# Patient Record
Sex: Male | Born: 1970
Health system: Southern US, Community
[De-identification: ages and names within clinical notes are randomized; demographics above are authoritative.]

## PROBLEM LIST (undated history)

## (undated) DIAGNOSIS — K4090 Unilateral inguinal hernia, without obstruction or gangrene, not specified as recurrent: Secondary | ICD-10-CM

## (undated) HISTORY — PX: PILONIDAL CYST EXCISION: SHX744

## (undated) HISTORY — PX: OTHER SURGICAL HISTORY: SHX169

## (undated) HISTORY — DX: Unilateral inguinal hernia, without obstruction or gangrene, not specified as recurrent: K40.90

---

## 1998-12-04 ENCOUNTER — Emergency Department (HOSPITAL_COMMUNITY): Admission: EM | Admit: 1998-12-04 | Discharge: 1998-12-04 | Payer: Self-pay | Admitting: Emergency Medicine

## 2006-10-31 ENCOUNTER — Encounter: Admission: RE | Admit: 2006-10-31 | Discharge: 2006-10-31 | Payer: Self-pay | Admitting: Family Medicine

## 2009-11-11 ENCOUNTER — Emergency Department (HOSPITAL_COMMUNITY): Admission: EM | Admit: 2009-11-11 | Discharge: 2009-11-11 | Payer: Self-pay | Admitting: Emergency Medicine

## 2011-02-09 DIAGNOSIS — L0591 Pilonidal cyst without abscess: Secondary | ICD-10-CM | POA: Insufficient documentation

## 2011-02-09 DIAGNOSIS — L723 Sebaceous cyst: Secondary | ICD-10-CM | POA: Insufficient documentation

## 2012-10-23 ENCOUNTER — Other Ambulatory Visit: Payer: Self-pay | Admitting: Family Medicine

## 2012-10-23 ENCOUNTER — Ambulatory Visit
Admission: RE | Admit: 2012-10-23 | Discharge: 2012-10-23 | Disposition: A | Discharge status: Home / Self Care | Payer: BC Managed Care – PPO | Source: Ambulatory Visit | Attending: Family Medicine | Admitting: Family Medicine

## 2012-10-23 DIAGNOSIS — R1032 Left lower quadrant pain: Secondary | ICD-10-CM

## 2012-10-23 MED ORDER — IOHEXOL 300 MG/ML  SOLN
100.0000 mL | Freq: Once | INTRAMUSCULAR | Status: AC | PRN
  Administered 2012-10-23: 100 mL via INTRAVENOUS

## 2012-12-16 DIAGNOSIS — K4090 Unilateral inguinal hernia, without obstruction or gangrene, not specified as recurrent: Secondary | ICD-10-CM

## 2012-12-16 HISTORY — DX: Unilateral inguinal hernia, without obstruction or gangrene, not specified as recurrent: K40.90

## 2013-01-09 ENCOUNTER — Ambulatory Visit (INDEPENDENT_AMBULATORY_CARE_PROVIDER_SITE_OTHER): Payer: BC Managed Care – PPO | Admitting: General Surgery

## 2013-01-09 ENCOUNTER — Encounter (INDEPENDENT_AMBULATORY_CARE_PROVIDER_SITE_OTHER): Payer: Self-pay | Admitting: General Surgery

## 2013-01-09 VITALS — BP 122/80 | HR 64 | Temp 98.4°F | Resp 16 | Ht 75.0 in | Wt 218.8 lb

## 2013-01-09 DIAGNOSIS — K4090 Unilateral inguinal hernia, without obstruction or gangrene, not specified as recurrent: Secondary | ICD-10-CM

## 2013-01-09 NOTE — Patient Instructions (Addendum)

## 2013-01-09 NOTE — Progress Notes (Signed)
Subjective:   left lower quadrant pain, inguinal hernia  Patient ID: Shannon Ford, male   DOB: 1971-04-08, 42 y.o.   MRN: [id number]  HPI Patient is a pleasant 42 year old male referred by Dr. Bosie Clos for recurrent left-sided abdominal pain and a left inguinal hernia. The patient's history dates back to an initial episode in 2007. At that time he developed pain in his left lower quadrant which he localizes just medial to his iliac crest.  This became fairly severe at that time. It seemed to be related to activity. He also had some associated diarrhea and fever and chills. A CT scan at that time was done which by history questioned diverticulitis but I do not have this report.  He also at that time was found to have a left inguinal hernia. He did have a surgical consultation but the upper shot of this was that the repair was not recommended. This illness gradually resolved. Of note he did have a colonoscopy at that time which was negative without evidence of colitis or diverticulosis. In the last few years he has had occasional twinges of discomfort usually related to activity but nothing that was severe enough to warrant medical attention. The patient also has a history of a ruptured lumbar disc by self history in college with right leg pain but the symptoms resolved without surgery. He has also been seen by urology for some urinary hesitancy. In April of this year he had recurrent pain very similar to what he had a 2008. A CT scan was obtained which I've reviewed showing some possible mild thickening of the sigmoid colon, very questionable, but no evidence of acute diverticulitis or other abnormality. Again this pain has gradually improved and now he only has occasional mild twinges of pain. This most recent illness also was associated with fever and chills and some diarrhea.  No past medical history on file. Past Surgical History  Procedure Laterality Date  . Pilonidal cyst excision    . Lazer vein  surgey     Current Outpatient Prescriptions  Medication Sig Dispense Refill  . amoxicillin (AMOXIL) 875 MG tablet       . benzonatate (TESSALON) 100 MG capsule       . fluticasone (FLONASE) 50 MCG/ACT nasal spray       . hyoscyamine (LEVBID) 0.375 MG 12 hr tablet       . metroNIDAZOLE (FLAGYL) 500 MG tablet       . nystatin (MYCOSTATIN) 100000 UNIT/ML suspension        No current facility-administered medications for this visit.   Allergies  Allergen Reactions  . Barium-Containing Compounds Itching    NOT CERTAIN if patient is allergic to readycat or not, started itching before 2nd bottle as oral contrast for ct scan, a. calhoun   History  Substance Use Topics  . Smoking status: Former Smoker    Quit date: 01/10/2012  . Smokeless tobacco: Never Used  . Alcohol Use: 2.4 oz/week    4 Cans of beer per week     Review of Systems  Constitutional: Negative.   Respiratory: Negative.   Cardiovascular: Negative.   Gastrointestinal: Positive for abdominal pain. Negative for nausea, diarrhea and blood in stool.       Objective:   Physical Exam BP 122/80  Pulse 64  Temp(Src) 98.4 F (36.9 C) (Temporal)  Resp 16  Ht 6\' 3"  (1.905 m)  Wt 218 lb 12.8 oz (99.247 kg)  BMI 27.35 kg/m2 General: Healthy-appearing Caucasian male in  no distress Skin: No rash or infection Lungs: Clear good breath sounds without increased work of breathing Cardiac regular rate and rhythm without murmurs. No edema. Abdomen: Soft with very minimal tenderness in the left lower quadrant and no guarding or palpable mass. No organomegaly. With the patient coughing and straining there is a small but definite probably indirect left inguinal hernia. It is not particularly tender. I cannot feel any hernia on the right GU normal male no testicular masses or tenderness Extremities: No joint swelling or deformity or edema    Assessment:     Recurrent left lower quadrant abdominal pain. He definitely has a small  left inguinal hernia on exam. However his symptoms are not typical for this pain being a little higher in the abdomen and 2 episodes of acute onset that have been associated with fever and change in bowel habits. However workup with colonoscopy and CT scan was essentially unremarkable. A long discussion with the patient regarding possible diagnoses including recurrent colitis or referred pain from his back or possibly pain from his left inguinal hernia. At his age and in good health he would be reasonable to fix his hernia even if it were asymptomatic. I however could not begin to guarantee that it would relieve his discomfort. We discussed laparoscopic repair of his left femoral hernia in detail including the nature of the surgery and expected recovery as well as risks of anesthetic complications, bleeding, infection, conversion to open procedure, rare risk of visceral or nerve injury and possible chronic pain secondary to the mesh. After discussion he feels like he would like to have his hernia fixed toward the end of the year and I think this is reasonable.    Plan:     laparoscopic repair of left inguinal hernia under general anesthesia as an outpatient. He will be in touch with [place] a later in the year regarding scheduling

## 2013-07-26 ENCOUNTER — Encounter (INDEPENDENT_AMBULATORY_CARE_PROVIDER_SITE_OTHER): Payer: Self-pay | Admitting: General Surgery

## 2013-07-26 ENCOUNTER — Ambulatory Visit (INDEPENDENT_AMBULATORY_CARE_PROVIDER_SITE_OTHER): Payer: BC Managed Care – PPO | Admitting: General Surgery

## 2013-07-26 ENCOUNTER — Encounter (INDEPENDENT_AMBULATORY_CARE_PROVIDER_SITE_OTHER): Payer: Self-pay

## 2013-07-26 VITALS — BP 136/84 | HR 66 | Temp 97.0°F | Resp 16 | Ht 75.0 in | Wt 225.8 lb

## 2013-07-26 DIAGNOSIS — K4090 Unilateral inguinal hernia, without obstruction or gangrene, not specified as recurrent: Secondary | ICD-10-CM

## 2013-07-26 NOTE — Progress Notes (Signed)
Chief complaint: Followup within one hernia  History: The patient returns to the office for followup of his left renal hernia. Please see my note from June of 2014. He has a history of possible diverticulitis or colitis with an episode of diarrhea and fever and chills in 2007. CT scan questioned diverticulitis at that time. He was also found to have a left inguinal hernia. He had colonoscopy at that time which was unremarkable. Since then he has continued to have some intermittent discomfort mainly in his left lower quadrant. He also since his last visit this past summer has had some increasing episodes of discomfort in his left groin.  No severe pain.  Past Medical History  Diagnosis Date  . Inguinal hernia without mention of obstruction or gangrene, unilateral or unspecified, (not specified as recurrent) 12/2012    [facility]   Past Surgical History  Procedure Laterality Date  . Pilonidal cyst excision    . Lazer vein surgey     No current outpatient prescriptions on file.   No current facility-administered medications for this visit.   Allergies  Allergen Reactions  . Barium-Containing Compounds Itching    NOT CERTAIN if patient is allergic to readycat or not, started itching before 2nd bottle as oral contrast for ct scan, a. calhoun   History  Substance Use Topics  . Smoking status: Former Smoker    Quit date: 01/10/2012  . Smokeless tobacco: Never Used  . Alcohol Use: 2.4 oz/week    4 Cans of beer per week   Exam: BP 136/84  Pulse 66  Temp(Src) 97 F (36.1 C) (Temporal)  Resp 16  Ht 6\' 3"  (1.905 m)  Wt 225 lb 12.8 oz (102.422 kg)  BMI 28.22 kg/m2 General: Well-developed Caucasian male in no distress Lungs: Clear breath sounds without resort reading Cardiac: Regular rate and rhythm. Abdomen: Standing and coughing and straining there is a definite small reducible left inguinal hernia which is probably indirect. I cannot feel any evidence of hernia on the right.  Assessment  and plan: Left inguinal hernia. As we discussed previously I cannot be sure that this is responsible for all the discomfort in his abdomen but I believe it is symptomatic and should be repaired. We discussed laparoscopic repair including the nature of the surgery and recovery as well as risks of bleeding, infection, visceral injury, anesthetic risk, recurrence and possible need for open repair.  We discussed that his higher left lower quadrant discomfort may or may not be related to the hernia. He like to go ahead with repair which is very reasonable we will schedule this for them under general anesthesia as an outpatient

## 2013-08-19 ENCOUNTER — Other Ambulatory Visit (INDEPENDENT_AMBULATORY_CARE_PROVIDER_SITE_OTHER): Payer: Self-pay | Admitting: *Deleted

## 2013-08-19 DIAGNOSIS — K4090 Unilateral inguinal hernia, without obstruction or gangrene, not specified as recurrent: Secondary | ICD-10-CM

## 2013-08-19 HISTORY — PX: HERNIA REPAIR: SHX51

## 2013-08-19 MED ORDER — OXYCODONE-ACETAMINOPHEN 5-325 MG PO TABS: 1.0000 | ORAL_TABLET | ORAL | Status: DC | PRN

## 2013-08-28 ENCOUNTER — Ambulatory Visit (INDEPENDENT_AMBULATORY_CARE_PROVIDER_SITE_OTHER): Payer: BC Managed Care – PPO | Admitting: General Surgery

## 2013-08-28 ENCOUNTER — Encounter (INDEPENDENT_AMBULATORY_CARE_PROVIDER_SITE_OTHER): Payer: Self-pay | Admitting: General Surgery

## 2013-08-28 VITALS — BP 126/82 | HR 80 | Temp 98.0°F | Resp 18 | Ht 73.0 in | Wt 218.0 lb

## 2013-08-28 DIAGNOSIS — Z09 Encounter for follow-up examination after completed treatment for conditions other than malignant neoplasm: Secondary | ICD-10-CM

## 2013-08-28 NOTE — Progress Notes (Signed)
History: Patient returns to the office 9 days following laparoscopic left inguinal hernia repair. He's been getting along well. A lot of soreness for the first few days but he is getting back to her normal activity with now just mild discomfort.  Exam: BP 126/82  Pulse 80  Temp(Src) 98 F (36.7 C)  Resp 18  Ht 6\' 1"  (1.854 m)  Wt 218 lb (98.884 kg)  BMI 28.77 kg/m2 General: Appears well Abdomen: Incisions are well-healed. There is mild tenderness in the left anal area but no evidence of hernia on exam.  Assessment and plan: Doing well following laparoscopic left anal hernia repair. We discussed a scheduled for return to normal activity. He is encouraged to call for any concerns and I will see him back as needed.

## 2013-09-13 ENCOUNTER — Ambulatory Visit (INDEPENDENT_AMBULATORY_CARE_PROVIDER_SITE_OTHER): Payer: BC Managed Care – PPO | Admitting: General Surgery

## 2013-09-13 ENCOUNTER — Encounter (INDEPENDENT_AMBULATORY_CARE_PROVIDER_SITE_OTHER): Payer: Self-pay | Admitting: General Surgery

## 2013-09-13 VITALS — BP 136/84 | HR 80 | Temp 97.0°F | Resp 16 | Ht 75.0 in | Wt 219.8 lb

## 2013-09-13 DIAGNOSIS — Z09 Encounter for follow-up examination after completed treatment for conditions other than malignant neoplasm: Secondary | ICD-10-CM

## 2013-09-13 NOTE — Patient Instructions (Signed)
Call as needed for any worsening symptoms or questions. Can take Aleve twice daily according to package instructions. We will see her back for recheck in 3 weeks.

## 2013-09-13 NOTE — Progress Notes (Signed)
Chief complaint: Left groin pain post hernia repair  History: This return to the office now 3 weeks following laparoscopic repair of a small indirect left inguinal hernia. I saw him 9 days postoperatively and he was doing very well. However beginning 3 days ago he has been having intermittent sharp pain in his left groin. On questioning this often seems to be related to flexing the hip. The pain will come on suddenly and be severe enough that he has a hard time moving or walking. It will last half-hour or a 3 hours and then typically will suddenly go away. He has felt a little up in the area of his previous hernia.  Exam: BP 136/84  Pulse 80  Temp(Src) 97 F (36.1 C) (Temporal)  Resp 16  Ht 6\' 3"  (1.905 m)  Wt 219 lb 12.8 oz (99.701 kg)  BMI 27.47 kg/m2 General: Appears well Abdomen: There is a small tender nodule about 1 cm at the level of the internal ring on the left. With coughing and straining there appears to be somewhat of an impulse here but it is subtle and I cannot be sure if this might be a recurrent hernia or possibly an area of inflammation and tenderness at the internal ring/spermatic cord..  Assessment and plan: Left groin pain post laparoscopic left inguinal hernia repair.  Possible early recurrence versus inflammation and tenderness at the internal ring/spermatic cord. I discussed these possibilities with the patient. At this point I think close observation is the best course. He will call me for any worsening pain. He will try to limit his physical activity somewhat. I will see him back in 3 weeks or sooner if necessary.

## 2013-09-30 ENCOUNTER — Encounter (INDEPENDENT_AMBULATORY_CARE_PROVIDER_SITE_OTHER): Payer: BC Managed Care – PPO | Admitting: General Surgery

## 2013-12-06 ENCOUNTER — Encounter: Payer: Self-pay | Admitting: Podiatrist

## 2013-12-06 ENCOUNTER — Ambulatory Visit (INDEPENDENT_AMBULATORY_CARE_PROVIDER_SITE_OTHER): Payer: BC Managed Care – PPO | Admitting: Podiatrist

## 2013-12-06 VITALS — BP 162/98 | HR 60 | Resp 12

## 2013-12-06 DIAGNOSIS — M722 Plantar fascial fibromatosis: Secondary | ICD-10-CM

## 2013-12-06 MED ORDER — TRIAMCINOLONE ACETONIDE 40 MG/ML IJ SUSP
20.0000 mg | Freq: Once | INTRAMUSCULAR | Status: AC
  Administered 2013-12-06: 20 mg

## 2013-12-06 NOTE — Progress Notes (Signed)
  Chief Complaint  Patient presents with  . Plantar Fasciitis    '' RT FOOT START HURTING AGAIN.''     HPI: Patient is 43 y.o. male who presents today for follow up of plantar fasciitis of the right foot.  He states his orthotics are comfortable but he is able to wear them in his athletic shoes but  Not his workboots.  He would like another pair for his work boots so he can wear them all the time.      Physical Exam Patient is awake, alert, and oriented x 3.  In no acute distress.  Vascular status is intact with palpable pedal pulses at 2/4 DP and PT bilateral and capillary refill time within normal limits. Neurological sensation is also intact bilaterally via Semmes Weinstein monofilament at 5/5 sites. Light touch, vibratory sensation, Achilles tendon reflex is intact. Dermatological exam reveals skin color, turger and texture as normal. No open lesions present.  Musculature intact with dorsiflexion, plantarflexion, inversion, eversion.  Pain on palpation plantar medial aspect of the right heel consistent with plantar fasciitis symptomatology is noted. High arched foot type bilaterally is noted.   Assessment: Plantar fasciitis right  Discussed etiology, pathology, conservative vs. Surgical therapies and at this time a plantar fascial injection was recommended.  The patient agreed and a sterile skin prep was applied.  An injection consisting of kenalog and marcaine mixture was infiltrated at the point of maximal tenderness on the right Heel.  The patient tolerated this well and was given instructions for aftercare. He was scanned for orthotics to go in his work boots.  He will bring in his boots to be sure the inserts fit in these shoes and modifications may need to be made.  He will also bring in his old orthotics to adjust as they could fit better in his athletic shoes.

## 2013-12-06 NOTE — Patient Instructions (Signed)

## 2013-12-11 ENCOUNTER — Telehealth: Payer: Self-pay | Admitting: *Deleted

## 2013-12-11 NOTE — Telephone Encounter (Signed)
You gave my husband a call and left him a message today.  We'd like to know if we need to set him up for an appointment or if there's a certain time period he needs to wait before getting another injection?  I returned her call.  I informed her that normally the next injection is given 2-3 weeks later.  I informed her that his orthotics should be here at that time as well.  I mentioned to her that we can tape his foot up he wants to try a strapping.  She said she will mention it to him to see what he thinks.

## 2013-12-11 NOTE — Telephone Encounter (Signed)
He saw her on Friday, had a cortisone shot put in his foot.  He's still having a lot of pain.  He wants to know if he should have immediate relief or does it take a little while for it to kick in.  Call him on his cell.  I called and left him a message that unfortunately we can't determine what affect the cortisone injection is going to have on each person.  It just varies from person to person.  I told him there is a series of 3 injections that can be given.  Hopefully he'll get some relief from the 2nd injection as well as the orthotics.  I asked that he call if he has any further questions or concerns.

## 2013-12-17 ENCOUNTER — Ambulatory Visit (INDEPENDENT_AMBULATORY_CARE_PROVIDER_SITE_OTHER): Payer: BC Managed Care – PPO

## 2013-12-17 VITALS — BP 120/71 | HR 62 | Resp 16

## 2013-12-17 DIAGNOSIS — M722 Plantar fascial fibromatosis: Secondary | ICD-10-CM

## 2013-12-17 MED ORDER — MELOXICAM 15 MG PO TABS: 15.0000 mg | ORAL_TABLET | Freq: Every day | ORAL | Status: DC

## 2013-12-17 MED ORDER — TRIAMCINOLONE ACETONIDE 10 MG/ML IJ SUSP: 10.0000 mg | Freq: Once | INTRAMUSCULAR | Status: DC

## 2013-12-17 NOTE — Patient Instructions (Signed)

## 2013-12-17 NOTE — Progress Notes (Signed)
   Subjective:    Patient ID: Shannon Ford, male    DOB: Sep 16, 1970, 43 y.o.   MRN: [id number]  HPI Comments: "Well, my foot is better, but really like I thought it would be. Still gives me some trouble"  Plantar Fasciitis - Follow up right heel   Dispensed orthotics and reviewed wearing instructions     Review of Systems no new findings or systemic changes noted     Objective:   Physical Exam Neurovascular status is intact pedal pulses palpable patient presents this time for pickup of a second set of orthotics to the lesion is working other shoes hours thickening have pain inferior calcaneal medial been plantar fascia medial trochanter tubercle on the right heel patient had 2 injections one more recent and one half a year ago has orthotics other shoes however the orthotics fit and contour well was given instructions for use the orthotic and break in and patient does have as requested I recommended a posterior injection tender with Kenalog 20 mg Xylocaine plain which is infiltrated to the inferior right heel.       Assessment & Plan:  Assessment recalcitrant plantar fasciitis/heel spur syndrome you orthotics are dispensed at this time a steroid injection tender with Kenalog infiltrated the inferior right heel tolerated well by the patient also recommended ice pack and also prescription MOBIC 15 mg once daily is issued to maintain his anti-inflammatory help with the heel pain recheck with in one month for followup with Dr. Jeannine Boga for her orthotic check and reevaluation for fasciitis if needed  Alvan Dame DPM

## 2013-12-27 ENCOUNTER — Other Ambulatory Visit: Payer: BC Managed Care – PPO

## 2014-03-12 ENCOUNTER — Other Ambulatory Visit: Payer: Self-pay | Admitting: Orthopedic Surgery

## 2014-03-12 DIAGNOSIS — M25511 Pain in right shoulder: Secondary | ICD-10-CM

## 2014-03-16 ENCOUNTER — Other Ambulatory Visit: Payer: Self-pay | Admitting: Orthopedic Surgery

## 2014-03-16 ENCOUNTER — Ambulatory Visit
Admission: RE | Admit: 2014-03-16 | Discharge: 2014-03-16 | Disposition: A | Discharge status: Home / Self Care | Payer: BC Managed Care – PPO | Source: Ambulatory Visit | Attending: Orthopedic Surgery | Admitting: Orthopedic Surgery

## 2014-03-16 ENCOUNTER — Other Ambulatory Visit: Payer: BC Managed Care – PPO

## 2014-03-16 DIAGNOSIS — M25511 Pain in right shoulder: Secondary | ICD-10-CM

## 2014-03-16 DIAGNOSIS — Z77018 Contact with and (suspected) exposure to other hazardous metals: Secondary | ICD-10-CM

## 2015-10-20 DIAGNOSIS — M75111 Incomplete rotator cuff tear or rupture of right shoulder, not specified as traumatic: Secondary | ICD-10-CM | POA: Diagnosis not present

## 2015-10-22 DIAGNOSIS — M75111 Incomplete rotator cuff tear or rupture of right shoulder, not specified as traumatic: Secondary | ICD-10-CM | POA: Diagnosis not present

## 2015-11-06 DIAGNOSIS — M75111 Incomplete rotator cuff tear or rupture of right shoulder, not specified as traumatic: Secondary | ICD-10-CM | POA: Diagnosis not present

## 2015-11-11 DIAGNOSIS — Z4789 Encounter for other orthopedic aftercare: Secondary | ICD-10-CM | POA: Diagnosis not present

## 2015-11-12 DIAGNOSIS — M75111 Incomplete rotator cuff tear or rupture of right shoulder, not specified as traumatic: Secondary | ICD-10-CM | POA: Diagnosis not present

## 2015-12-25 ENCOUNTER — Other Ambulatory Visit: Payer: Self-pay | Admitting: Family Medicine

## 2015-12-25 ENCOUNTER — Ambulatory Visit
Admission: RE | Admit: 2015-12-25 | Discharge: 2015-12-25 | Disposition: A | Discharge status: Home / Self Care | Payer: Managed Care, Other (non HMO) | Source: Ambulatory Visit | Attending: Family Medicine | Admitting: Family Medicine

## 2015-12-25 DIAGNOSIS — R059 Cough, unspecified: Secondary | ICD-10-CM

## 2015-12-25 DIAGNOSIS — R05 Cough: Secondary | ICD-10-CM

## 2017-02-20 ENCOUNTER — Ambulatory Visit (INDEPENDENT_AMBULATORY_CARE_PROVIDER_SITE_OTHER): Payer: Managed Care, Other (non HMO)

## 2017-02-20 ENCOUNTER — Ambulatory Visit (INDEPENDENT_AMBULATORY_CARE_PROVIDER_SITE_OTHER): Payer: Managed Care, Other (non HMO) | Admitting: Podiatry

## 2017-02-20 ENCOUNTER — Encounter: Payer: Self-pay | Admitting: Podiatry

## 2017-02-20 DIAGNOSIS — M722 Plantar fascial fibromatosis: Secondary | ICD-10-CM | POA: Diagnosis not present

## 2017-02-20 MED ORDER — DICLOFENAC SODIUM 75 MG PO TBEC: 75.0000 mg | DELAYED_RELEASE_TABLET | Freq: Two times a day (BID) | ORAL | 0 refills | Status: DC

## 2017-02-20 NOTE — Progress Notes (Signed)
   Subjective:    Patient ID: Shannon Ford, male    DOB: 02/07/1971, 46 y.o.   MRN: [id number]  HPI  Chief Complaint  Patient presents with  . Foot Pain    Heel Lt foot bottom  medial side onset 5 months       Review of Systems  All other systems reviewed and are negative.      Objective:   Physical Exam        Assessment & Plan:

## 2017-02-21 MED ORDER — BETAMETHASONE SOD PHOS & ACET 6 (3-3) MG/ML IJ SUSP: 3.0000 mg | Freq: Once | INTRAMUSCULAR | Status: DC

## 2017-02-21 NOTE — Progress Notes (Signed)
Patient ID: Shannon Ford, male   DOB: [birth year], 46 y.o.   MRN: [id number]   Subjective: Patient presents today for pain and tenderness in the left foot. Patient states the foot pain has been hurting for several weeks now. Patient states that it hurts in the mornings with the first steps out of bed. Patient presents today for further treatment and evaluation  Objective: Physical Exam General: The patient is alert and oriented x3 in no acute distress.  Dermatology: Skin is warm, dry and supple bilateral lower extremities. Negative for open lesions or macerations bilateral.   Vascular: Dorsalis Pedis and Posterior Tibial pulses palpable bilateral.  Capillary fill time is immediate to all digits.  Neurological: Epicritic and protective threshold intact bilateral.   Musculoskeletal: Tenderness to palpation at the medial calcaneal tubercale and through the insertion of the plantar fascia of the left foot. All other joints range of motion within normal limits bilateral. Strength 5/5 in all groups bilateral.   Radiographic exam:   Normal osseous mineralization. Joint spaces preserved. No fracture/dislocation/boney destruction. Calcaneal spur present with mild thickening of plantar fascia left. No other soft tissue abnormalities or radiopaque foreign bodies.   Assessment: 1. Plantar fasciitis left foot  Plan of Care:  1. Patient evaluated. Xrays reviewed.   2. Injection of 0.5cc Celestone soluspan injected into the left plantar fascia.  3. Rx for Diclofenac 75mg  PO BID ordered for patient. 4. Plantar fascial band(s) dispensed  5. Instructed patient regarding therapies and modalities at home to alleviate symptoms.  6. Return to clinic in 4 weeks.     Felecia ShellingBrent M. Heidi Maclin, DPM [facility]  Dr. Felecia ShellingBrent M. Wolfgang Finigan, DPM    [address]                                     [place], [place] [address]                Office [phone]  Fax [phone]

## 2017-04-03 ENCOUNTER — Ambulatory Visit: Payer: Managed Care, Other (non HMO) | Admitting: Podiatry

## 2017-04-04 ENCOUNTER — Telehealth: Payer: Self-pay | Admitting: *Deleted

## 2017-04-04 MED ORDER — DICLOFENAC SODIUM 75 MG PO TBEC: 75.0000 mg | DELAYED_RELEASE_TABLET | Freq: Two times a day (BID) | ORAL | 0 refills | Status: DC

## 2017-04-04 NOTE — Telephone Encounter (Signed)
Received refill request for diclofenac . Dr. Logan Bores states pt needs an appt for follow up, refill for #30.

## 2017-05-16 ENCOUNTER — Ambulatory Visit (INDEPENDENT_AMBULATORY_CARE_PROVIDER_SITE_OTHER): Payer: Managed Care, Other (non HMO) | Admitting: Sports Medicine

## 2017-05-16 ENCOUNTER — Encounter: Payer: Self-pay | Admitting: Sports Medicine

## 2017-05-16 DIAGNOSIS — M722 Plantar fascial fibromatosis: Secondary | ICD-10-CM

## 2017-05-16 DIAGNOSIS — M79672 Pain in left foot: Secondary | ICD-10-CM

## 2017-05-16 MED ORDER — TRIAMCINOLONE ACETONIDE 10 MG/ML IJ SUSP: 10.0000 mg | Freq: Once | INTRAMUSCULAR | Status: DC

## 2017-05-16 NOTE — Progress Notes (Signed)
Subjective: Shannon Ford Shannon Ford is a 46 y.o. male patient presents to office with complaint of heel pain on the left. Patient admits to post static dyskinesia for 8 months in duration. Patient has treated this problem with injection, insoles, stretching, and icing with no relief. Reports that the plantar fascial brace helps. Desires to discuss another pain of insoles. Denies any other pedal complaints.   There are no active problems to display for this patient.   Current Outpatient Prescriptions on File Prior to Visit  Medication Sig Dispense Refill  . IBUPROFEN PO Take by mouth as needed.      Current Facility-Administered Medications on File Prior to Visit  Medication Dose Route Frequency Provider Last Rate Last Dose  . betamethasone acetate-betamethasone sodium phosphate (CELESTONE) injection 3 mg  3 mg Intramuscular Once Shannon LewandowskyEvans, Shannon Ford, DPM      . triamcinolone acetonide (KENALOG) 10 MG/ML injection 10 mg  10 mg Other Once Shannon Ford, Shannon Ford, DPM        Allergies  Allergen Reactions  . Barium-Containing Compounds Itching    NOT CERTAIN if patient is allergic to readycat or not, started itching before 2nd bottle as oral contrast for ct scan, Shannon Ford    Objective: Physical Exam General: The patient is alert and oriented x3 in no acute distress.  Dermatology: Skin is warm, dry and supple bilateral lower extremities. Nails 1-10 are normal. There is no erythema, edema, no eccymosis, no open lesions present. Integument is otherwise unremarkable.  Vascular: Dorsalis Pedis pulse and Posterior Tibial pulse are 2/4 bilateral. Capillary fill time is immediate to all digits.  Neurological: Grossly intact to light touch with an achilles reflex of +2/5 and a  negative Tinel's sign bilateral.  Musculoskeletal: Tenderness to palpation at the medial calcaneal tubercale and through the insertion of the plantar fascia on the left foot. No pain with compression of calcaneus bilateral. No pain with  tuning fork to calcaneus bilateral. No pain with calf compression bilateral. There is decreased Ankle joint range of motion bilateral. All other joints range of motion within normal limits bilateral. Strength 5/5 in all groups bilateral.   Assessment and Plan: Problem List Items Addressed This Visit    None    Visit Diagnoses    Plantar fasciitis, left    -  Primary   Relevant Medications   triamcinolone acetonide (KENALOG) 10 MG/ML injection 10 mg (Start on 05/16/2017 12:45 PM)   Pain of left heel         -Complete examination performed.  -Previous Xrays reviewed -Discussed with patient in detail the condition of plantar fasciitis, how this occurs and general treatment options. Explained both conservative and surgical treatments.  -After oral consent and aseptic prep, injected a mixture containing 1 ml of 2%  plain lidocaine, 1 ml 0.5% plain marcaine, 0.5 ml of kenalog 10 and 0.5 ml of dexamethasone phosphate into left heel. Post-injection care discussed with patient.  -Dispensed night splint and instructed on use  -Recommended good supportive shoes and advised use of OTC insert. Dispensed Powersteps; patient made aware of charge -Continue with fascial brace -Continue with daily stretching exercises. -Recommend patient to continue to ice affected area 1-2x daily. -Patient to return to office in 3-4 weeks for follow up or sooner if problems or questions arise.  Shannon Ford, DPM

## 2017-05-22 ENCOUNTER — Ambulatory Visit: Payer: Managed Care, Other (non HMO) | Admitting: Podiatry

## 2017-06-12 ENCOUNTER — Telehealth: Payer: Self-pay | Admitting: *Deleted

## 2017-06-12 NOTE — Telephone Encounter (Signed)
Request 90 day supply of diclofenac. Pt was to see Dr. Marylene LandStover for follow-up. Return fax denying refill.

## 2017-08-17 DIAGNOSIS — F418 Other specified anxiety disorders: Secondary | ICD-10-CM | POA: Diagnosis not present

## 2017-08-17 DIAGNOSIS — R030 Elevated blood-pressure reading, without diagnosis of hypertension: Secondary | ICD-10-CM | POA: Diagnosis not present

## 2017-08-17 DIAGNOSIS — F988 Other specified behavioral and emotional disorders with onset usually occurring in childhood and adolescence: Secondary | ICD-10-CM | POA: Diagnosis not present

## 2017-10-16 DIAGNOSIS — F988 Other specified behavioral and emotional disorders with onset usually occurring in childhood and adolescence: Secondary | ICD-10-CM | POA: Diagnosis not present

## 2018-05-28 DIAGNOSIS — M25511 Pain in right shoulder: Secondary | ICD-10-CM | POA: Diagnosis not present

## 2018-06-05 DIAGNOSIS — F988 Other specified behavioral and emotional disorders with onset usually occurring in childhood and adolescence: Secondary | ICD-10-CM | POA: Diagnosis not present

## 2018-06-25 DIAGNOSIS — M25511 Pain in right shoulder: Secondary | ICD-10-CM | POA: Diagnosis not present

## 2018-06-25 DIAGNOSIS — M7541 Impingement syndrome of right shoulder: Secondary | ICD-10-CM | POA: Diagnosis not present

## 2018-07-03 DIAGNOSIS — M25511 Pain in right shoulder: Secondary | ICD-10-CM | POA: Diagnosis not present

## 2018-07-06 DIAGNOSIS — S0101XA Laceration without foreign body of scalp, initial encounter: Secondary | ICD-10-CM | POA: Diagnosis not present

## 2018-07-09 DIAGNOSIS — M25511 Pain in right shoulder: Secondary | ICD-10-CM | POA: Diagnosis not present

## 2018-07-12 DIAGNOSIS — Z4802 Encounter for removal of sutures: Secondary | ICD-10-CM | POA: Diagnosis not present

## 2018-08-06 DIAGNOSIS — M542 Cervicalgia: Secondary | ICD-10-CM | POA: Diagnosis not present

## 2018-09-21 ENCOUNTER — Other Ambulatory Visit: Payer: Self-pay | Admitting: Podiatry

## 2018-09-21 ENCOUNTER — Ambulatory Visit (INDEPENDENT_AMBULATORY_CARE_PROVIDER_SITE_OTHER): Payer: BLUE CROSS/BLUE SHIELD | Admitting: Podiatry

## 2018-09-21 ENCOUNTER — Ambulatory Visit (INDEPENDENT_AMBULATORY_CARE_PROVIDER_SITE_OTHER): Payer: BLUE CROSS/BLUE SHIELD

## 2018-09-21 DIAGNOSIS — M779 Enthesopathy, unspecified: Secondary | ICD-10-CM

## 2018-09-21 DIAGNOSIS — M722 Plantar fascial fibromatosis: Secondary | ICD-10-CM

## 2018-09-21 DIAGNOSIS — M79671 Pain in right foot: Secondary | ICD-10-CM

## 2018-09-21 MED ORDER — METHYLPREDNISOLONE 4 MG PO TBPK: ORAL_TABLET | ORAL | 0 refills | Status: DC

## 2018-09-23 NOTE — Progress Notes (Signed)
Subjective: 48 year old male presents the office today for concerns of pain to the right forefoot which is been on the for last 4 days.  He states it started after he was standing on a wooden surface for some time.  He denies any recent injury or trauma.  He has previously been treated for plantar fasciitis.  He also states he did break his right third toe about 8 months ago but no current pain. Denies any systemic complaints such as fevers, chills, nausea, vomiting. No acute changes since last appointment, and no other complaints at this time.   Objective: AAO x3, NAD DP/PT pulses palpable bilaterally, CRT less than 3 seconds No pain specifically to the third digit today.  There is no pain on the plantar heel today.  There is discomfort just proximal to the metatarsal heads there is localized edema at this area.  There is no evidence of foreign body.  There is no erythema or warmth.  No pain with MPJ range of motion. No open lesions or pre-ulcerative lesions.  No pain with calf compression, swelling, warmth, erythema  Assessment: Right foot pain, tendinitis/capsulitis  Plan: -All treatment options discussed with the patient including all alternatives, risks, complications.  -X-rays were obtained reviewed.  No evidence of acute fracture or stress fracture. -The steroid injection was performed to the area.  Through a dorsal approach in just proximal to the second third metatarsal heads is able to get the plantar aspect of his foot on the area of tenderness.  Mixture of 1 cc Kenalog 10, 0.5 cc of Marcaine plain, 0.5 cc of lidocaine plain was infiltrated without complications.  Postinjection care was discussed. -Prescribed Medrol Dosepak. Discussed side effects of the medication and directed to stop if any are to occur and call the office.  -Metatarsal offloading pads dispensed. -Patient encouraged to call the office with any questions, concerns, change in symptoms.   Return in about 3 weeks  (around 10/12/2018).  Vivi Barrack DPM

## 2018-10-12 ENCOUNTER — Ambulatory Visit: Payer: BLUE CROSS/BLUE SHIELD | Admitting: Podiatry

## 2019-02-08 DIAGNOSIS — Z1159 Encounter for screening for other viral diseases: Secondary | ICD-10-CM | POA: Diagnosis not present

## 2019-02-08 DIAGNOSIS — Z20828 Contact with and (suspected) exposure to other viral communicable diseases: Secondary | ICD-10-CM | POA: Diagnosis not present

## 2019-02-11 DIAGNOSIS — T63791A Toxic effect of contact with other venomous plant, accidental (unintentional), initial encounter: Secondary | ICD-10-CM | POA: Diagnosis not present

## 2019-02-11 DIAGNOSIS — R21 Rash and other nonspecific skin eruption: Secondary | ICD-10-CM | POA: Diagnosis not present

## 2019-02-11 DIAGNOSIS — L299 Pruritus, unspecified: Secondary | ICD-10-CM | POA: Diagnosis not present

## 2019-07-01 DIAGNOSIS — J302 Other seasonal allergic rhinitis: Secondary | ICD-10-CM | POA: Diagnosis not present

## 2019-07-01 DIAGNOSIS — R05 Cough: Secondary | ICD-10-CM | POA: Diagnosis not present

## 2019-07-01 DIAGNOSIS — F988 Other specified behavioral and emotional disorders with onset usually occurring in childhood and adolescence: Secondary | ICD-10-CM | POA: Diagnosis not present

## 2019-07-01 DIAGNOSIS — Z8619 Personal history of other infectious and parasitic diseases: Secondary | ICD-10-CM | POA: Diagnosis not present

## 2019-07-03 ENCOUNTER — Other Ambulatory Visit: Payer: Self-pay | Admitting: Family Medicine

## 2019-07-03 ENCOUNTER — Ambulatory Visit
Admission: RE | Admit: 2019-07-03 | Discharge: 2019-07-03 | Disposition: A | Discharge status: Home / Self Care | Payer: BLUE CROSS/BLUE SHIELD | Source: Ambulatory Visit | Attending: Family Medicine | Admitting: Family Medicine

## 2019-07-03 DIAGNOSIS — R053 Chronic cough: Secondary | ICD-10-CM

## 2019-07-03 DIAGNOSIS — R05 Cough: Secondary | ICD-10-CM | POA: Diagnosis not present

## 2019-08-15 ENCOUNTER — Ambulatory Visit (INDEPENDENT_AMBULATORY_CARE_PROVIDER_SITE_OTHER): Payer: BC Managed Care – PPO | Admitting: Emergency Medicine

## 2019-08-15 ENCOUNTER — Other Ambulatory Visit: Payer: Self-pay

## 2019-08-15 ENCOUNTER — Encounter: Payer: Self-pay | Admitting: Emergency Medicine

## 2019-08-15 DIAGNOSIS — R05 Cough: Secondary | ICD-10-CM

## 2019-08-15 DIAGNOSIS — R053 Chronic cough: Secondary | ICD-10-CM

## 2019-08-15 MED ORDER — BENZONATATE 200 MG PO CAPS: 200.0000 mg | ORAL_CAPSULE | Freq: Three times a day (TID) | ORAL | 1 refills | Status: AC | PRN

## 2019-08-15 MED ORDER — MONTELUKAST SODIUM 10 MG PO TABS: 10.0000 mg | ORAL_TABLET | Freq: Every day | ORAL | 1 refills | Status: AC

## 2019-08-15 MED ORDER — HYDROCODONE-HOMATROPINE 5-1.5 MG/5ML PO SYRP: 5.0000 mL | ORAL_SOLUTION | Freq: Four times a day (QID) | ORAL | 0 refills | Status: AC | PRN

## 2019-08-15 NOTE — Assessment & Plan Note (Signed)
Post COVID-19 infection in October.  It sounds like the most relevant sustaining factor is allergic rhinitis.  He feels drainage all the time.  May have started to get some benefit from a nasal steroid although he had to stop it due to nosebleeding.  We will start Allegra, Singulair, see if he can start Flonase and less frequent schedule.  If he continues to cough then add a PPI after 2 weeks.  He does have a history of tobacco although he denies any dyspnea.  If cough persists then we will move to pulmonary function testing, consider an airway inspection/bronchoscopy.  Please start Allegra 180 mg once daily until next visit. Please start Singulair 10 mg each evening until next visit Try restarting your fluticasone nasal spray carefully, 1 spray each nostril every other day or every third day.  If you have bleeding then stop. Try to avoid throat clearing if at all possible.  You might benefit from using a sugar-free candy, nonmethylated cough drop.  When you feel the urge to clear your throat, just swallow. Try using Tessalon Perles up to every 6 hours if you need them to suppress your cough Try using Hycodan 5 cc up to every 6 hours if you need it to suppress your cough.  This may be particularly beneficial at night.  This can sometimes cause drowsiness. If you are still coughing after 2 weeks of the above, please start over-the-counter Prilosec once a day. Follow with Dr. Delton Coombes in about 1 month.  Depending on how you are doing we will talk about future work-up.

## 2019-08-15 NOTE — Addendum Note (Signed)
Addended by: Maurene Capes on: 08/15/2019 11:12 AM   Modules accepted: Orders

## 2019-08-15 NOTE — Progress Notes (Signed)
Subjective:    Patient ID: Shannon Ford, male    DOB: 09/25/1970, 49 y.o.   MRN: [id number]  HPI 49 year old man, former smoker (20 pack years) with little other past medical history beyond hernia repair in 2014. He is referred today for evaluation of chronic cough.   He reports that he was well until he had COVID in October - characterized by HA, fatigue, more drainage, productive cough. Everything improved except he still doesn't have sense of smell back, but he has kept a dry cough, globus sensation. Coughs every day, lots of throat clearing. He continues to have drainage to his throat. He started flonase 2 weeks ago, has seen some epistaxis. May have been helping some. He has only rare GERD, was treated for it years ago, not now. 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    CXR 07/03/19 reviewed by me, shows no infiltrate or abnormality   Review of Systems As above  Past Medical History:  Diagnosis Date  . Inguinal hernia without mention of obstruction or gangrene, unilateral or unspecified, (not specified as recurrent) 12/2012   H/O LIH     Family History  Problem Relation Age of Onset  . Cancer Mother        lung     Social History   Socioeconomic History  . Marital status: Married    Spouse name: Not on file  . Number of children: Not on file  . Years of education: Not on file  . Highest education level: Not on file  Occupational History  . Not on file  Tobacco Use  . Smoking status: Former Smoker    Start date: 07/18/1989    Quit date: 01/10/2012    Years since quitting: 7.6  . Smokeless tobacco: Never Used  Substance and Sexual Activity  . Alcohol use: Yes    Alcohol/week: 4.0 standard drinks    Types: 4 Cans of beer per week  . Drug use: No  . Sexual activity: Not on file  Other Topics Concern  . Not on file  Social History Narrative  . Not on file   Social Determinants of Health   Financial Resource Strain:   . Difficulty of  Paying Living Expenses: Not on file  Food Insecurity:   . Worried About Charity fundraiser in the Last Year: Not on file  . Ran Out of Food in the Last Year: Not on file  Transportation Needs:   . Lack of Transportation (Medical): Not on file  . Lack of Transportation (Non-Medical): Not on file  Physical Activity:   . Days of Exercise per Week: Not on file  . Minutes of Exercise per Session: Not on file  Stress:   . Feeling of Stress : Not on file  Social Connections:   . Frequency of Communication with Friends and Family: Not on file  . Frequency of Social Gatherings with Friends and Family: Not on file  . Attends Religious Services: Not on file  . Active Member of Clubs or Organizations: Not on file  . Attends Archivist Meetings: Not on file  . Marital Status: Not on file  Intimate Partner Violence:   . Fear of Current or Ex-Partner: Not on file  . Emotionally Abused: Not on file  . Physically Abused: Not on file  . 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  No  mold exposure Owns a cat and a dog, now owns chickens and 2 ducks.   No feather pillows  Allergies  Allergen Reactions  . Barium-Containing Compounds Itching    NOT CERTAIN if patient is allergic to readycat or not, started itching before 2nd bottle as oral contrast for ct scan, a. calhoun     Outpatient Medications Prior to Visit  Medication Sig Dispense Refill  . IBUPROFEN PO Take by mouth as needed.     Marland Kitchen lisdexamfetamine (VYVANSE) 50 MG capsule Vyvanse 50 mg capsule    . methylPREDNISolone (MEDROL DOSEPAK) 4 MG TBPK tablet Take as directed 21 tablet 0  . VYVANSE 50 MG capsule     . betamethasone acetate-betamethasone sodium phosphate (CELESTONE) injection 3 mg     . triamcinolone acetonide (KENALOG) 10 MG/ML injection 10 mg     . triamcinolone acetonide (KENALOG) 10 MG/ML injection 10 mg      No facility-administered medications  prior to visit.   \     Objective:   Physical Exam  Vitals:   08/15/19 1020  BP: 118/80  Pulse: 69  Temp: (!) 97.3 F (36.3 C)  TempSrc: Temporal  SpO2: 97%  Weight: 228 lb (103.4 kg)  Height: 6\' 3"  (1.905 m)   Gen: Pleasant, well-nourished, in no distress,  normal affect  ENT: No lesions,  mouth clear,  oropharynx clear, no postnasal drip  Neck: No JVD, no stridor  Lungs: No use of accessory muscles, no crackles or wheezing on normal respiration, no wheeze on forced expiration  Cardiovascular: RRR, heart sounds normal, no murmur or gallops, no peripheral edema  Musculoskeletal: No deformities, no cyanosis or clubbing  Neuro: alert, awake, non focal  Skin: Warm, no lesions or rash     Assessment & Plan:  Chronic cough Post COVID-19 infection in October.  It sounds like the most relevant sustaining factor is allergic rhinitis.  He feels drainage all the time.  May have started to get some benefit from a nasal steroid although he had to stop it due to nosebleeding.  We will start Allegra, Singulair, see if he can start Flonase and less frequent schedule.  If he continues to cough then add a PPI after 2 weeks.  He does have a history of tobacco although he denies any dyspnea.  If cough persists then we will move to pulmonary function testing, consider an airway inspection/bronchoscopy.  Please start Allegra 180 mg once daily until next visit. Please start Singulair 10 mg each evening until next visit Try restarting your fluticasone nasal spray carefully, 1 spray each nostril every other day or every third day.  If you have bleeding then stop. Try to avoid throat clearing if at all possible.  You might benefit from using a sugar-free candy, nonmethylated cough drop.  When you feel the urge to clear your throat, just swallow. Try using Tessalon Perles up to every 6 hours if you need them to suppress your cough Try using Hycodan 5 cc up to every 6 hours if you need it to  suppress your cough.  This may be particularly beneficial at night.  This can sometimes cause drowsiness. If you are still coughing after 2 weeks of the above, please start over-the-counter Prilosec once a day. Follow with Dr. November in about 1 month.  Depending on how you are doing we will talk about future work-up.   Delton Coombes, MD, PhD 08/15/2019, 10:52 AM [facility] [phone] or if no answer [phone]

## 2019-08-15 NOTE — Patient Instructions (Signed)
Please start Allegra 180 mg once daily until next visit. Please start Singulair 10 mg each evening until next visit Try restarting your fluticasone nasal spray carefully, 1 spray each nostril every other day or every third day.  If you have bleeding then stop. Try to avoid throat clearing if at all possible.  You might benefit from using a sugar-free candy, nonmethylated cough drop.  When you feel the urge to clear your throat, just swallow. Try using Tessalon Perles up to every 6 hours if you need them to suppress your cough Try using Hycodan 5 cc up to every 6 hours if you need it to suppress your cough.  This may be particularly beneficial at night.  This can sometimes cause drowsiness. If you are still coughing after 2 weeks of the above, please start over-the-counter Prilosec once a day. Follow with Dr. Delton Coombes in about 1 month.  Depending on how you are doing we will talk about future work-up.

## 2019-09-16 ENCOUNTER — Ambulatory Visit: Payer: BC Managed Care – PPO | Admitting: Emergency Medicine

## 2019-11-15 DIAGNOSIS — R0982 Postnasal drip: Secondary | ICD-10-CM | POA: Diagnosis not present

## 2019-11-15 DIAGNOSIS — R438 Other disturbances of smell and taste: Secondary | ICD-10-CM | POA: Diagnosis not present

## 2020-01-22 DIAGNOSIS — M25521 Pain in right elbow: Secondary | ICD-10-CM | POA: Diagnosis not present

## 2020-01-22 DIAGNOSIS — M7711 Lateral epicondylitis, right elbow: Secondary | ICD-10-CM | POA: Diagnosis not present

## 2020-06-25 DIAGNOSIS — L814 Other melanin hyperpigmentation: Secondary | ICD-10-CM | POA: Diagnosis not present

## 2020-06-25 DIAGNOSIS — L738 Other specified follicular disorders: Secondary | ICD-10-CM | POA: Diagnosis not present

## 2020-06-25 DIAGNOSIS — L570 Actinic keratosis: Secondary | ICD-10-CM | POA: Diagnosis not present

## 2020-06-25 DIAGNOSIS — L821 Other seborrheic keratosis: Secondary | ICD-10-CM | POA: Diagnosis not present

## 2020-07-30 DIAGNOSIS — Z20828 Contact with and (suspected) exposure to other viral communicable diseases: Secondary | ICD-10-CM | POA: Diagnosis not present

## 2020-08-06 DIAGNOSIS — M25511 Pain in right shoulder: Secondary | ICD-10-CM | POA: Diagnosis not present

## 2020-08-06 DIAGNOSIS — N529 Male erectile dysfunction, unspecified: Secondary | ICD-10-CM | POA: Diagnosis not present

## 2020-08-06 DIAGNOSIS — Z125 Encounter for screening for malignant neoplasm of prostate: Secondary | ICD-10-CM | POA: Diagnosis not present

## 2020-08-06 DIAGNOSIS — R030 Elevated blood-pressure reading, without diagnosis of hypertension: Secondary | ICD-10-CM | POA: Diagnosis not present

## 2020-08-06 DIAGNOSIS — Z0000 Encounter for general adult medical examination without abnormal findings: Secondary | ICD-10-CM | POA: Diagnosis not present

## 2020-08-06 DIAGNOSIS — E7800 Pure hypercholesterolemia, unspecified: Secondary | ICD-10-CM | POA: Diagnosis not present

## 2020-08-06 DIAGNOSIS — F988 Other specified behavioral and emotional disorders with onset usually occurring in childhood and adolescence: Secondary | ICD-10-CM | POA: Diagnosis not present

## 2020-08-06 DIAGNOSIS — Z833 Family history of diabetes mellitus: Secondary | ICD-10-CM | POA: Diagnosis not present

## 2020-09-02 DIAGNOSIS — M25511 Pain in right shoulder: Secondary | ICD-10-CM | POA: Diagnosis not present

## 2020-09-03 ENCOUNTER — Other Ambulatory Visit: Payer: Self-pay | Admitting: Orthopedic Surgery

## 2020-09-03 DIAGNOSIS — M25511 Pain in right shoulder: Secondary | ICD-10-CM

## 2020-09-21 DIAGNOSIS — M79642 Pain in left hand: Secondary | ICD-10-CM | POA: Diagnosis not present

## 2020-09-23 ENCOUNTER — Ambulatory Visit
Admission: RE | Admit: 2020-09-23 | Discharge: 2020-09-23 | Disposition: A | Discharge status: Home / Self Care | Payer: BC Managed Care – PPO | Source: Ambulatory Visit | Attending: Orthopedic Surgery | Admitting: Orthopedic Surgery

## 2020-09-23 ENCOUNTER — Other Ambulatory Visit: Payer: Self-pay

## 2020-09-23 DIAGNOSIS — M25511 Pain in right shoulder: Secondary | ICD-10-CM

## 2020-10-02 DIAGNOSIS — M25511 Pain in right shoulder: Secondary | ICD-10-CM | POA: Diagnosis not present

## 2020-10-02 DIAGNOSIS — M75111 Incomplete rotator cuff tear or rupture of right shoulder, not specified as traumatic: Secondary | ICD-10-CM | POA: Diagnosis not present

## 2020-12-23 DIAGNOSIS — M79642 Pain in left hand: Secondary | ICD-10-CM | POA: Diagnosis not present

## 2020-12-23 DIAGNOSIS — R1310 Dysphagia, unspecified: Secondary | ICD-10-CM | POA: Diagnosis not present

## 2020-12-23 DIAGNOSIS — J302 Other seasonal allergic rhinitis: Secondary | ICD-10-CM | POA: Diagnosis not present

## 2021-02-26 DIAGNOSIS — S67190A Crushing injury of right index finger, initial encounter: Secondary | ICD-10-CM | POA: Diagnosis not present

## 2021-03-12 DIAGNOSIS — S86811A Strain of other muscle(s) and tendon(s) at lower leg level, right leg, initial encounter: Secondary | ICD-10-CM | POA: Diagnosis not present

## 2021-04-30 DIAGNOSIS — F988 Other specified behavioral and emotional disorders with onset usually occurring in childhood and adolescence: Secondary | ICD-10-CM | POA: Diagnosis not present

## 2021-06-28 DIAGNOSIS — D225 Melanocytic nevi of trunk: Secondary | ICD-10-CM | POA: Diagnosis not present

## 2021-06-28 DIAGNOSIS — L821 Other seborrheic keratosis: Secondary | ICD-10-CM | POA: Diagnosis not present

## 2021-06-28 DIAGNOSIS — L818 Other specified disorders of pigmentation: Secondary | ICD-10-CM | POA: Diagnosis not present

## 2021-06-28 DIAGNOSIS — L814 Other melanin hyperpigmentation: Secondary | ICD-10-CM | POA: Diagnosis not present

## 2021-09-03 DIAGNOSIS — Z0000 Encounter for general adult medical examination without abnormal findings: Secondary | ICD-10-CM | POA: Diagnosis not present

## 2021-09-03 DIAGNOSIS — Z23 Encounter for immunization: Secondary | ICD-10-CM | POA: Diagnosis not present

## 2021-09-03 DIAGNOSIS — E7800 Pure hypercholesterolemia, unspecified: Secondary | ICD-10-CM | POA: Diagnosis not present

## 2021-09-03 DIAGNOSIS — Z125 Encounter for screening for malignant neoplasm of prostate: Secondary | ICD-10-CM | POA: Diagnosis not present

## 2021-09-16 DIAGNOSIS — D125 Benign neoplasm of sigmoid colon: Secondary | ICD-10-CM | POA: Diagnosis not present

## 2021-09-16 DIAGNOSIS — Z1211 Encounter for screening for malignant neoplasm of colon: Secondary | ICD-10-CM | POA: Diagnosis not present

## 2021-09-16 DIAGNOSIS — K648 Other hemorrhoids: Secondary | ICD-10-CM | POA: Diagnosis not present

## 2021-10-22 DIAGNOSIS — M25551 Pain in right hip: Secondary | ICD-10-CM | POA: Diagnosis not present

## 2021-10-25 DIAGNOSIS — M25551 Pain in right hip: Secondary | ICD-10-CM | POA: Diagnosis not present

## 2021-10-29 DIAGNOSIS — M1611 Unilateral primary osteoarthritis, right hip: Secondary | ICD-10-CM | POA: Diagnosis not present

## 2021-11-02 IMAGING — DX DG CHEST 2V
2 series · 2 of 2 positions shown · non-contrast
Comparison: 12/25/2015

CLINICAL DATA: Dry cough for several weeks

EXAM:
CHEST - 2 VIEW

[dg chest 2 view (1 of 2)]
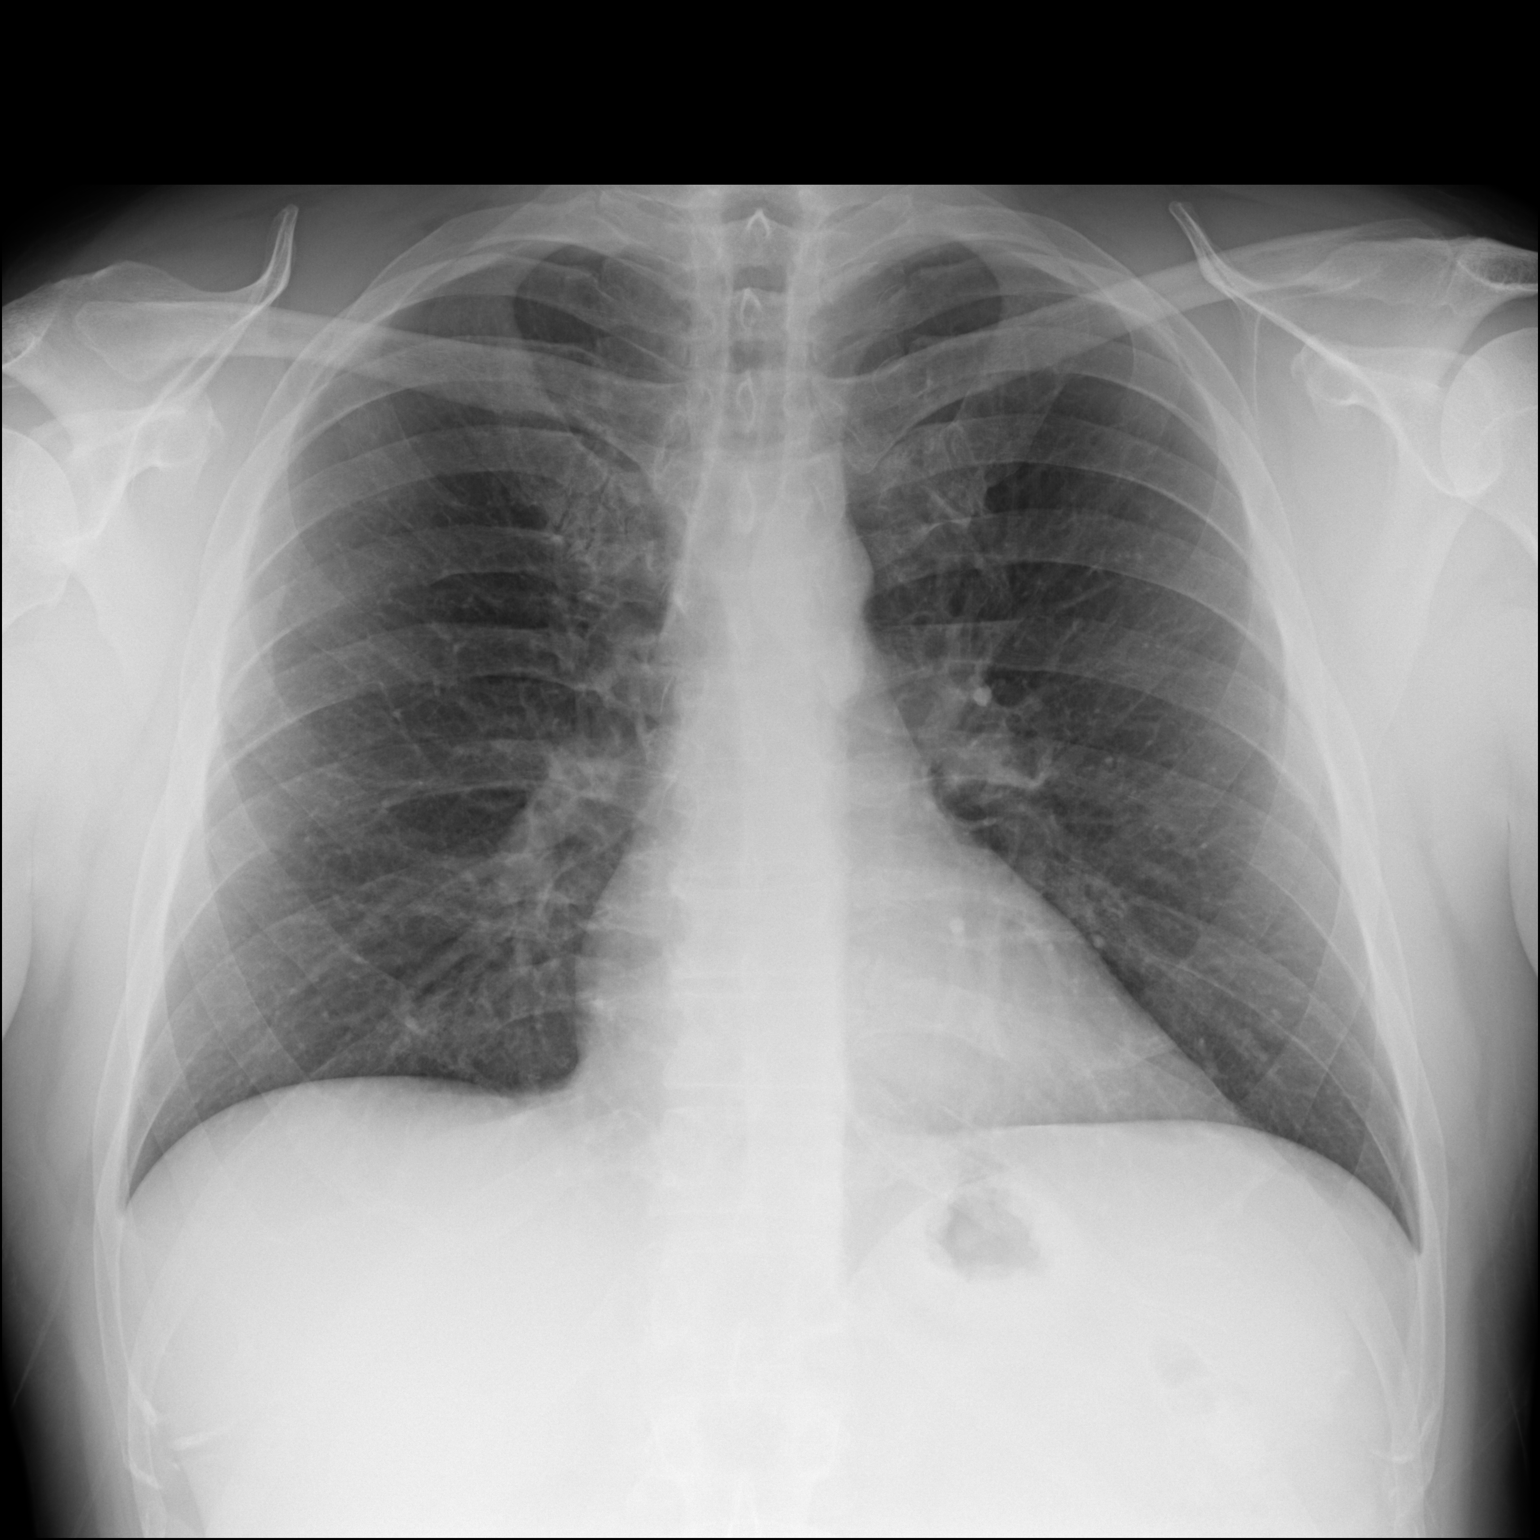

[dg chest 2 view (2 of 2)]
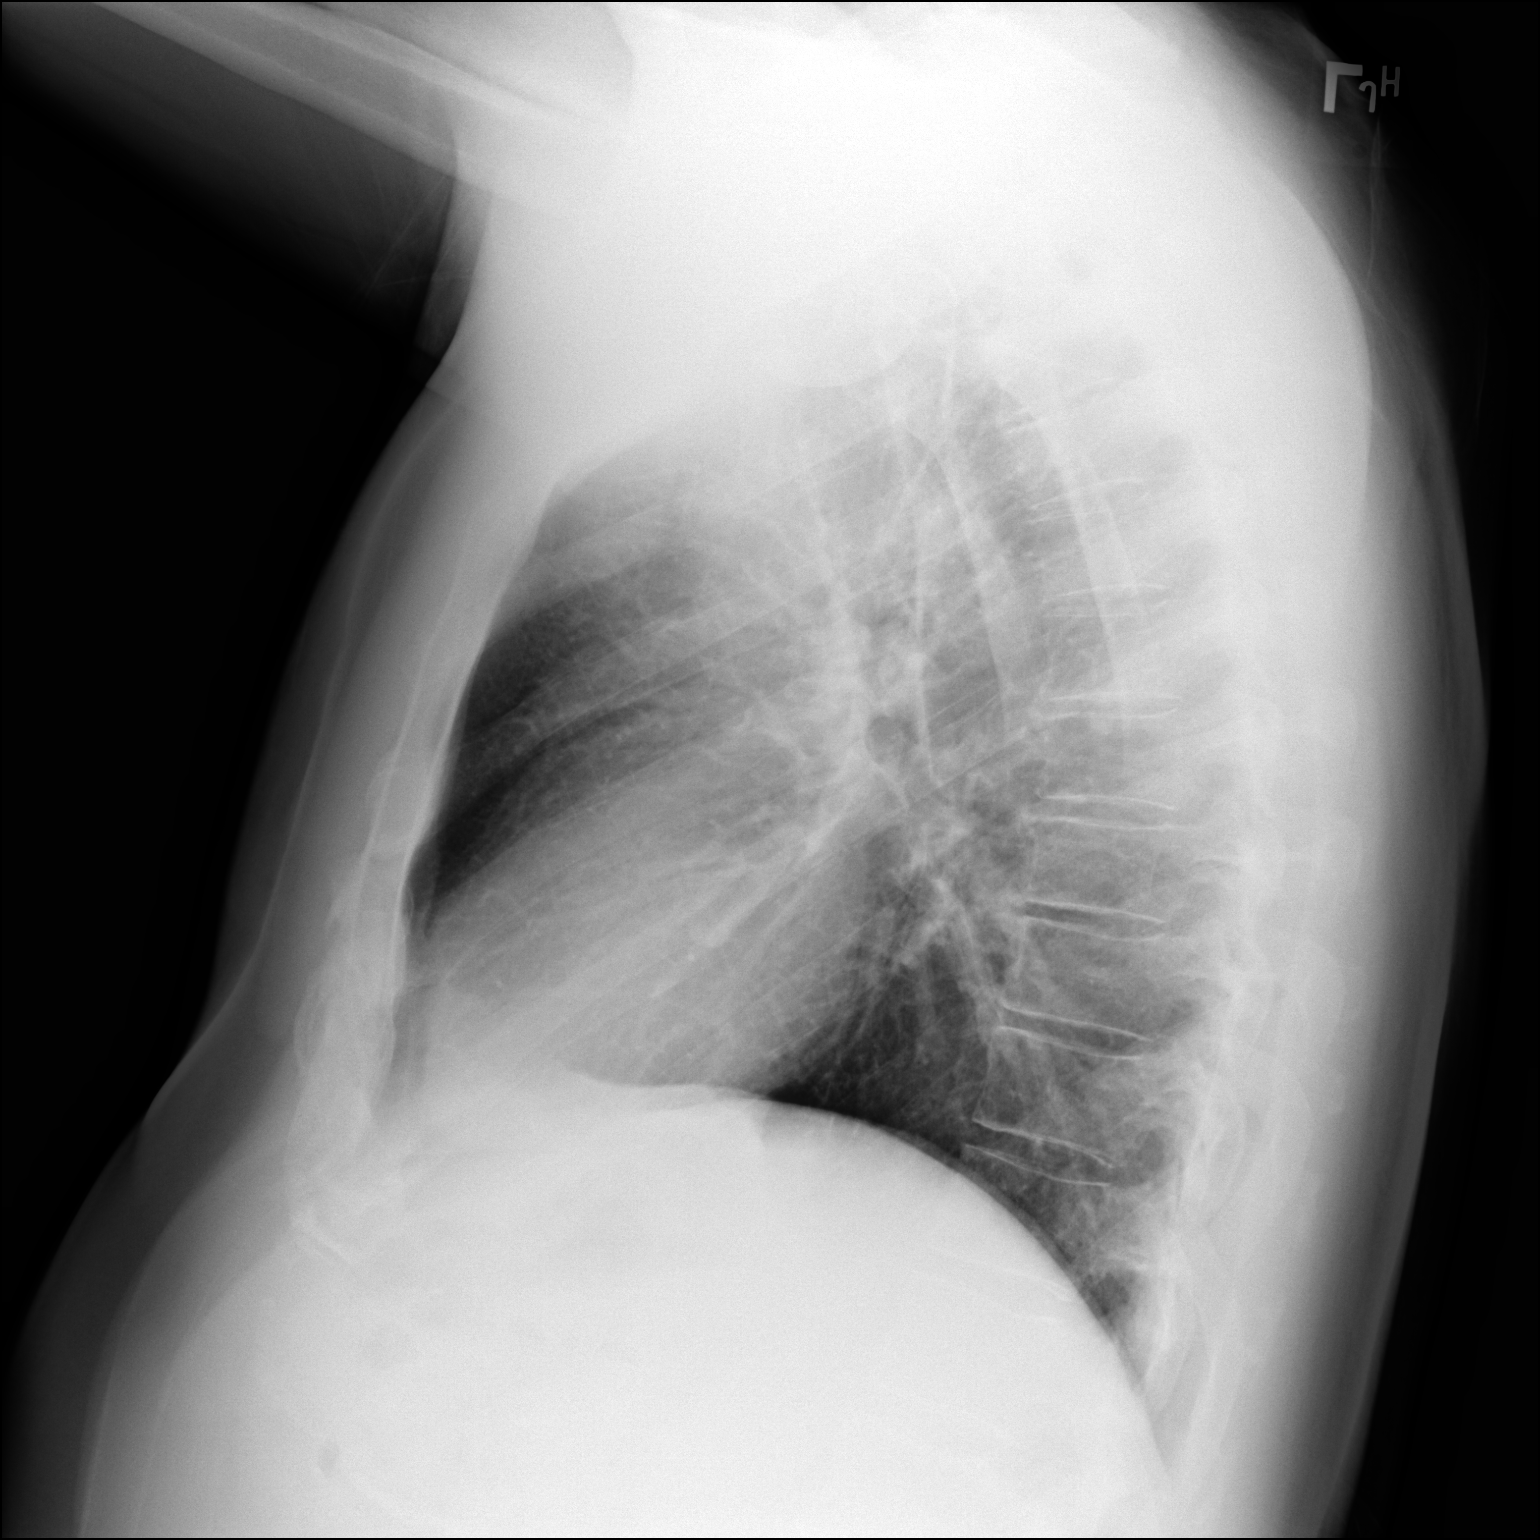

[2 of 2 positions shown; findings below may reference images not displayed]

FINDINGS: Cardiac shadow is within normal limits. The lungs are well aerated
bilaterally. No focal infiltrate or sizable effusion is seen. No
bony abnormality is noted.
IMPRESSION: No active cardiopulmonary disease.

## 2022-06-28 DIAGNOSIS — L821 Other seborrheic keratosis: Secondary | ICD-10-CM | POA: Diagnosis not present

## 2022-06-28 DIAGNOSIS — D2372 Other benign neoplasm of skin of left lower limb, including hip: Secondary | ICD-10-CM | POA: Diagnosis not present

## 2022-06-28 DIAGNOSIS — L814 Other melanin hyperpigmentation: Secondary | ICD-10-CM | POA: Diagnosis not present

## 2022-08-31 DIAGNOSIS — H10812 Pingueculitis, left eye: Secondary | ICD-10-CM | POA: Diagnosis not present

## 2022-10-17 DIAGNOSIS — N529 Male erectile dysfunction, unspecified: Secondary | ICD-10-CM | POA: Diagnosis not present

## 2022-10-17 DIAGNOSIS — E7800 Pure hypercholesterolemia, unspecified: Secondary | ICD-10-CM | POA: Diagnosis not present

## 2022-10-17 DIAGNOSIS — Z125 Encounter for screening for malignant neoplasm of prostate: Secondary | ICD-10-CM | POA: Diagnosis not present

## 2022-10-17 DIAGNOSIS — F988 Other specified behavioral and emotional disorders with onset usually occurring in childhood and adolescence: Secondary | ICD-10-CM | POA: Diagnosis not present

## 2022-10-17 DIAGNOSIS — R7303 Prediabetes: Secondary | ICD-10-CM | POA: Diagnosis not present

## 2022-10-17 DIAGNOSIS — R030 Elevated blood-pressure reading, without diagnosis of hypertension: Secondary | ICD-10-CM | POA: Diagnosis not present

## 2022-10-17 DIAGNOSIS — Z0000 Encounter for general adult medical examination without abnormal findings: Secondary | ICD-10-CM | POA: Diagnosis not present

## 2023-02-07 ENCOUNTER — Other Ambulatory Visit (HOSPITAL_BASED_OUTPATIENT_CLINIC_OR_DEPARTMENT_OTHER): Payer: Self-pay | Admitting: Physician Assistant

## 2023-02-07 DIAGNOSIS — S83421D Sprain of lateral collateral ligament of right knee, subsequent encounter: Secondary | ICD-10-CM

## 2023-02-17 ENCOUNTER — Ambulatory Visit (HOSPITAL_BASED_OUTPATIENT_CLINIC_OR_DEPARTMENT_OTHER): Payer: BC Managed Care – PPO

## 2023-04-03 DIAGNOSIS — D492 Neoplasm of unspecified behavior of bone, soft tissue, and skin: Secondary | ICD-10-CM | POA: Diagnosis not present

## 2023-04-03 DIAGNOSIS — L821 Other seborrheic keratosis: Secondary | ICD-10-CM | POA: Diagnosis not present

## 2023-04-03 DIAGNOSIS — L570 Actinic keratosis: Secondary | ICD-10-CM | POA: Diagnosis not present

## 2023-04-18 DIAGNOSIS — E7800 Pure hypercholesterolemia, unspecified: Secondary | ICD-10-CM | POA: Diagnosis not present

## 2023-04-18 DIAGNOSIS — N529 Male erectile dysfunction, unspecified: Secondary | ICD-10-CM | POA: Diagnosis not present

## 2023-04-18 DIAGNOSIS — F988 Other specified behavioral and emotional disorders with onset usually occurring in childhood and adolescence: Secondary | ICD-10-CM | POA: Diagnosis not present

## 2023-04-18 DIAGNOSIS — R7303 Prediabetes: Secondary | ICD-10-CM | POA: Diagnosis not present

## 2023-04-18 DIAGNOSIS — J302 Other seasonal allergic rhinitis: Secondary | ICD-10-CM | POA: Diagnosis not present

## 2023-06-19 DIAGNOSIS — M1612 Unilateral primary osteoarthritis, left hip: Secondary | ICD-10-CM | POA: Diagnosis not present

## 2023-07-10 DIAGNOSIS — L821 Other seborrheic keratosis: Secondary | ICD-10-CM | POA: Diagnosis not present

## 2023-07-10 DIAGNOSIS — D225 Melanocytic nevi of trunk: Secondary | ICD-10-CM | POA: Diagnosis not present

## 2023-07-10 DIAGNOSIS — L812 Freckles: Secondary | ICD-10-CM | POA: Diagnosis not present

## 2023-07-10 DIAGNOSIS — L814 Other melanin hyperpigmentation: Secondary | ICD-10-CM | POA: Diagnosis not present

## 2023-08-11 DIAGNOSIS — M1612 Unilateral primary osteoarthritis, left hip: Secondary | ICD-10-CM | POA: Diagnosis not present

## 2023-08-24 ENCOUNTER — Other Ambulatory Visit: Payer: Self-pay | Admitting: Student

## 2023-08-24 DIAGNOSIS — M25562 Pain in left knee: Secondary | ICD-10-CM

## 2023-09-09 ENCOUNTER — Ambulatory Visit
Admission: RE | Admit: 2023-09-09 | Discharge: 2023-09-09 | Disposition: A | Discharge status: Home / Self Care | Payer: Self-pay | Source: Ambulatory Visit | Attending: Student | Admitting: Student

## 2023-09-09 DIAGNOSIS — M23322 Other meniscus derangements, posterior horn of medial meniscus, left knee: Secondary | ICD-10-CM | POA: Diagnosis not present

## 2023-09-09 DIAGNOSIS — M25562 Pain in left knee: Secondary | ICD-10-CM

## 2023-10-17 DIAGNOSIS — L0390 Cellulitis, unspecified: Secondary | ICD-10-CM | POA: Diagnosis not present

## 2023-10-17 DIAGNOSIS — M109 Gout, unspecified: Secondary | ICD-10-CM | POA: Diagnosis not present

## 2023-12-15 ENCOUNTER — Encounter (HOSPITAL_COMMUNITY)

## 2023-12-18 ENCOUNTER — Other Ambulatory Visit (HOSPITAL_COMMUNITY): Payer: Self-pay | Admitting: Specialist

## 2023-12-18 ENCOUNTER — Ambulatory Visit (HOSPITAL_COMMUNITY)
Admission: RE | Admit: 2023-12-18 | Discharge: 2023-12-18 | Disposition: A | Discharge status: Home / Self Care | Payer: Worker's Compensation | Source: Ambulatory Visit | Attending: Surgery | Admitting: Surgery

## 2023-12-18 DIAGNOSIS — M79605 Pain in left leg: Secondary | ICD-10-CM | POA: Diagnosis present

## 2024-01-12 DIAGNOSIS — J302 Other seasonal allergic rhinitis: Secondary | ICD-10-CM | POA: Diagnosis not present

## 2024-01-12 DIAGNOSIS — Z125 Encounter for screening for malignant neoplasm of prostate: Secondary | ICD-10-CM | POA: Diagnosis not present

## 2024-01-12 DIAGNOSIS — E7800 Pure hypercholesterolemia, unspecified: Secondary | ICD-10-CM | POA: Diagnosis not present

## 2024-01-12 DIAGNOSIS — N529 Male erectile dysfunction, unspecified: Secondary | ICD-10-CM | POA: Diagnosis not present

## 2024-01-12 DIAGNOSIS — F988 Other specified behavioral and emotional disorders with onset usually occurring in childhood and adolescence: Secondary | ICD-10-CM | POA: Diagnosis not present

## 2024-01-12 DIAGNOSIS — R7303 Prediabetes: Secondary | ICD-10-CM | POA: Diagnosis not present

## 2024-01-12 DIAGNOSIS — Z0000 Encounter for general adult medical examination without abnormal findings: Secondary | ICD-10-CM | POA: Diagnosis not present

## 2024-06-27 DIAGNOSIS — J302 Other seasonal allergic rhinitis: Secondary | ICD-10-CM | POA: Diagnosis not present

## 2024-06-27 DIAGNOSIS — N529 Male erectile dysfunction, unspecified: Secondary | ICD-10-CM | POA: Diagnosis not present

## 2024-06-27 DIAGNOSIS — E7800 Pure hypercholesterolemia, unspecified: Secondary | ICD-10-CM | POA: Diagnosis not present

## 2024-06-27 DIAGNOSIS — F988 Other specified behavioral and emotional disorders with onset usually occurring in childhood and adolescence: Secondary | ICD-10-CM | POA: Diagnosis not present

## 2024-08-07 ENCOUNTER — Other Ambulatory Visit: Payer: Self-pay | Admitting: Specialist

## 2024-08-07 DIAGNOSIS — M25562 Pain in left knee: Secondary | ICD-10-CM

## 2024-08-20 ENCOUNTER — Other Ambulatory Visit

## 2024-08-22 ENCOUNTER — Inpatient Hospital Stay
Admission: RE | Admit: 2024-08-22 | Discharge: 2024-08-22 | Payer: Worker's Compensation | Attending: Specialist | Admitting: Specialist

## 2024-08-22 DIAGNOSIS — M25562 Pain in left knee: Secondary | ICD-10-CM
